# Patient Record
Sex: Female | Born: 1945 | Race: White | Hispanic: No | Marital: Married
Health system: Southern US, Community
[De-identification: ages and names within clinical notes are randomized; demographics above are authoritative.]

---

## 2016-11-29 ENCOUNTER — Other Ambulatory Visit (HOSPITAL_COMMUNITY): Payer: Medicare Other

## 2016-11-29 ENCOUNTER — Inpatient Hospital Stay
Admission: RE | Admit: 2016-11-29 | Discharge: 2017-01-09 | Disposition: A | Discharge status: Skilled Nursing Facility | Payer: Medicare Other | Source: Ambulatory Visit | Attending: Internal Medicine | Admitting: Internal Medicine

## 2016-11-29 ENCOUNTER — Ambulatory Visit (HOSPITAL_COMMUNITY)
Admission: AD | Admit: 2016-11-29 | Discharge: 2016-11-29 | Disposition: A | Discharge status: Home / Self Care | Payer: Medicare Other | Source: Other Acute Inpatient Hospital | Attending: Internal Medicine | Admitting: Internal Medicine

## 2016-11-29 DIAGNOSIS — J9690 Respiratory failure, unspecified, unspecified whether with hypoxia or hypercapnia: Secondary | ICD-10-CM

## 2016-11-29 DIAGNOSIS — Z4659 Encounter for fitting and adjustment of other gastrointestinal appliance and device: Secondary | ICD-10-CM

## 2016-11-29 DIAGNOSIS — Z978 Presence of other specified devices: Secondary | ICD-10-CM

## 2016-11-29 DIAGNOSIS — Z431 Encounter for attention to gastrostomy: Secondary | ICD-10-CM

## 2016-11-29 DIAGNOSIS — Z931 Gastrostomy status: Secondary | ICD-10-CM

## 2016-11-29 DIAGNOSIS — Z0189 Encounter for other specified special examinations: Secondary | ICD-10-CM

## 2016-11-29 LAB — CBC WITH DIFFERENTIAL/PLATELET
BASOS ABS: 0 10*3/uL (ref 0.0–0.1)
Basophils Relative: 0 %
EOS ABS: 0.2 10*3/uL (ref 0.0–0.7)
Eosinophils Relative: 1 %
HCT: 23.6 % — ABNORMAL LOW (ref 36.0–46.0)
HEMOGLOBIN: 7.7 g/dL — AB (ref 12.0–15.0)
LYMPHS PCT: 13 %
Lymphs Abs: 2.4 10*3/uL (ref 0.7–4.0)
MCH: 31.8 pg (ref 26.0–34.0)
MCHC: 32.6 g/dL (ref 30.0–36.0)
MCV: 97.5 fL (ref 78.0–100.0)
MONOS PCT: 24 %
Monocytes Absolute: 4.4 10*3/uL — ABNORMAL HIGH (ref 0.1–1.0)
Neutro Abs: 11.2 10*3/uL — ABNORMAL HIGH (ref 1.7–7.7)
Neutrophils Relative %: 62 %
Platelets: 149 10*3/uL — ABNORMAL LOW (ref 150–400)
RBC: 2.42 MIL/uL — AB (ref 3.87–5.11)
RDW: 16 % — ABNORMAL HIGH (ref 11.5–15.5)
WBC: 18.2 10*3/uL — ABNORMAL HIGH (ref 4.0–10.5)

## 2016-11-29 LAB — BLOOD GAS, ARTERIAL
ACID-BASE EXCESS: 3.3 mmol/L — AB (ref 0.0–2.0)
BICARBONATE: 27.1 mmol/L (ref 20.0–28.0)
FIO2: 30
LHR: 12 {breaths}/min
MECHVT: 350 mL
O2 Saturation: 94.5 %
PEEP/CPAP: 5 cmH2O
Patient temperature: 98.6
pCO2 arterial: 39.7 mmHg (ref 32.0–48.0)
pH, Arterial: 7.448 (ref 7.350–7.450)
pO2, Arterial: 73.8 mmHg — ABNORMAL LOW (ref 83.0–108.0)

## 2016-11-29 LAB — HEMOGLOBIN A1C
HEMOGLOBIN A1C: 6.4 % — AB (ref 4.8–5.6)
MEAN PLASMA GLUCOSE: 136.98 mg/dL

## 2016-11-30 LAB — VANCOMYCIN, TROUGH: VANCOMYCIN TR: 29 ug/mL — AB (ref 15–20)

## 2016-11-30 LAB — COMPREHENSIVE METABOLIC PANEL
ALBUMIN: 1.9 g/dL — AB (ref 3.5–5.0)
ALT: 39 U/L (ref 14–54)
AST: 79 U/L — ABNORMAL HIGH (ref 15–41)
Alkaline Phosphatase: 226 U/L — ABNORMAL HIGH (ref 38–126)
Anion gap: 10 (ref 5–15)
BUN: 23 mg/dL — AB (ref 6–20)
CHLORIDE: 101 mmol/L (ref 101–111)
CO2: 27 mmol/L (ref 22–32)
CREATININE: 1.56 mg/dL — AB (ref 0.44–1.00)
Calcium: 7.5 mg/dL — ABNORMAL LOW (ref 8.9–10.3)
GFR calc non Af Amer: 33 mL/min — ABNORMAL LOW (ref >60)
GFR, EST AFRICAN AMERICAN: 38 mL/min — AB (ref >60)
Glucose, Bld: 189 mg/dL — ABNORMAL HIGH (ref 65–99)
POTASSIUM: 3.1 mmol/L — AB (ref 3.5–5.1)
SODIUM: 138 mmol/L (ref 135–145)
Total Bilirubin: 0.9 mg/dL (ref 0.3–1.2)
Total Protein: 5.2 g/dL — ABNORMAL LOW (ref 6.5–8.1)

## 2016-11-30 LAB — CBC WITH DIFFERENTIAL/PLATELET
BAND NEUTROPHILS: 2 %
BASOS ABS: 0 10*3/uL (ref 0.0–0.1)
BASOS PCT: 0 %
Blasts: 0 %
EOS ABS: 0.2 10*3/uL (ref 0.0–0.7)
EOS PCT: 1 %
HCT: 23.1 % — ABNORMAL LOW (ref 36.0–46.0)
Hemoglobin: 7.5 g/dL — ABNORMAL LOW (ref 12.0–15.0)
LYMPHS ABS: 2.1 10*3/uL (ref 0.7–4.0)
Lymphocytes Relative: 12 %
MCH: 32.1 pg (ref 26.0–34.0)
MCHC: 32.5 g/dL (ref 30.0–36.0)
MCV: 98.7 fL (ref 78.0–100.0)
METAMYELOCYTES PCT: 2 %
MONO ABS: 0.9 10*3/uL (ref 0.1–1.0)
MYELOCYTES: 1 %
Monocytes Relative: 5 %
Neutro Abs: 14.7 10*3/uL — ABNORMAL HIGH (ref 1.7–7.7)
Neutrophils Relative %: 77 %
Other: 0 %
PLATELETS: 191 10*3/uL (ref 150–400)
Promyelocytes Absolute: 0 %
RBC: 2.34 MIL/uL — ABNORMAL LOW (ref 3.87–5.11)
RDW: 16.3 % — ABNORMAL HIGH (ref 11.5–15.5)
WBC: 17.9 10*3/uL — AB (ref 4.0–10.5)
nRBC: 0 /100 WBC

## 2016-11-30 LAB — HEMOGLOBIN A1C
HEMOGLOBIN A1C: 6.4 % — AB (ref 4.8–5.6)
Mean Plasma Glucose: 136.98 mg/dL

## 2016-11-30 LAB — C DIFFICILE QUICK SCREEN W PCR REFLEX
C DIFFICILE (CDIFF) TOXIN: NEGATIVE
C DIFFICLE (CDIFF) ANTIGEN: NEGATIVE
C Diff interpretation: NOT DETECTED

## 2016-12-01 LAB — VANCOMYCIN, TROUGH: VANCOMYCIN TR: 20 ug/mL (ref 15–20)

## 2016-12-01 NOTE — H&P (Signed)
PREOPERATIVE H&P  Chief Complaint: Respiratory failure  HPI: Nichole [name] is a 71 y.o. female who presents for evaluation of tracheostomy. [place] with mental status changes. She subsequently developed seizures and required intubation for airway protection. EEGs demonstrated left temporal parietal foci of seizure activity. 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 Follow-up EEG's demonstrated encephalopathy. She also has history of coronary artery disease. She was treated conservatively and [facility]. At time of transfer it was recommended that she undergo tracheotomy as she had been intubated since 11/ 3 without significant improvement in her mental status. I was subsequently consulted on 11/14. She's taken to the operating room at this time for tracheotomy.  No past medical history on file. No past surgical history on file. Social History   Socioeconomic History  . Marital status: Married    Spouse name: Not on file  . Number of children: Not on file  . Years of education: Not on file  . Highest education level: Not on file  Social Needs  . Financial resource strain: Not on file  . Food insecurity - worry: Not on file  . Food insecurity - inability: Not on file  . Transportation needs - medical: Not on file  . Transportation needs - non-medical: Not on file  Occupational History  . Not on file  Tobacco Use  . Smoking status: Not on file  Substance and Sexual Activity  . Alcohol use: Not on file  . Drug use: Not on file  . Sexual activity: Not on file  Other Topics Concern  . Not on file  Social History Narrative  . Not on file   No family history on file. Allergies not on file Prior to Admission medications   Not on File     Positive ROS: neg  All other systems have been reviewed and were otherwise negative with the exception of those mentioned in the HPI and as  above.  Physical Exam: There were no vitals filed for this visit.  General: Intubated on the ventilator. Nonresponsive. Neck: No palpable adenopathy or thyroid nodules. Obese neck. Trachea midl Cardiovascular: Regular rate and rhythm, no murmur.  Respiratory: Clear to auscultation  Assessment/Plan: RESPRITORY FAILURE Plan for Procedure(s): TRACHEOSTOMY   Dillard Cannonhristopher Newman, MD 12/01/2016 4:57 PM

## 2016-12-02 ENCOUNTER — Encounter
Admission: RE | Disposition: A | Discharge status: Skilled Nursing Facility | Payer: Self-pay | Source: Ambulatory Visit | Attending: Internal Medicine

## 2016-12-02 ENCOUNTER — Encounter (HOSPITAL_COMMUNITY): Payer: Medicare Other | Admitting: Anesthesiology

## 2016-12-02 ENCOUNTER — Other Ambulatory Visit (HOSPITAL_COMMUNITY): Payer: Medicare Other

## 2016-12-02 HISTORY — PX: TRACHEOSTOMY TUBE PLACEMENT: SHX814

## 2016-12-02 LAB — BASIC METABOLIC PANEL
ANION GAP: 5 (ref 5–15)
BUN: 25 mg/dL — ABNORMAL HIGH (ref 6–20)
CALCIUM: 7.4 mg/dL — AB (ref 8.9–10.3)
CO2: 31 mmol/L (ref 22–32)
Chloride: 101 mmol/L (ref 101–111)
Creatinine, Ser: 1.38 mg/dL — ABNORMAL HIGH (ref 0.44–1.00)
GFR, EST AFRICAN AMERICAN: 44 mL/min — AB (ref >60)
GFR, EST NON AFRICAN AMERICAN: 38 mL/min — AB (ref >60)
Glucose, Bld: 207 mg/dL — ABNORMAL HIGH (ref 65–99)
POTASSIUM: 3.2 mmol/L — AB (ref 3.5–5.1)
SODIUM: 137 mmol/L (ref 135–145)

## 2016-12-02 LAB — CBC WITH DIFFERENTIAL/PLATELET
BASOS PCT: 0 %
BLASTS: 0 %
Band Neutrophils: 1 %
Basophils Absolute: 0 10*3/uL (ref 0.0–0.1)
Eosinophils Absolute: 0.2 10*3/uL (ref 0.0–0.7)
Eosinophils Relative: 1 %
HCT: 23.5 % — ABNORMAL LOW (ref 36.0–46.0)
HEMOGLOBIN: 7.5 g/dL — AB (ref 12.0–15.0)
LYMPHS PCT: 14 %
Lymphs Abs: 2.2 10*3/uL (ref 0.7–4.0)
MCH: 31.4 pg (ref 26.0–34.0)
MCHC: 31.9 g/dL (ref 30.0–36.0)
MCV: 98.3 fL (ref 78.0–100.0)
MONO ABS: 1.6 10*3/uL — AB (ref 0.1–1.0)
MYELOCYTES: 0 %
Metamyelocytes Relative: 2 %
Monocytes Relative: 10 %
NEUTROS PCT: 72 %
Neutro Abs: 11.9 10*3/uL — ABNORMAL HIGH (ref 1.7–7.7)
Other: 0 %
PLATELETS: 208 10*3/uL (ref 150–400)
PROMYELOCYTES ABS: 0 %
RBC: 2.39 MIL/uL — AB (ref 3.87–5.11)
RDW: 16.7 % — ABNORMAL HIGH (ref 11.5–15.5)
SMEAR REVIEW: ADEQUATE
WBC: 15.9 10*3/uL — AB (ref 4.0–10.5)
nRBC: 0 /100 WBC

## 2016-12-02 LAB — CULTURE, RESPIRATORY: CULTURE: NORMAL

## 2016-12-02 LAB — VANCOMYCIN, TROUGH: Vancomycin Tr: 21 ug/mL (ref 15–20)

## 2016-12-02 LAB — CULTURE, RESPIRATORY W GRAM STAIN

## 2016-12-02 LAB — MAGNESIUM: MAGNESIUM: 1.6 mg/dL — AB (ref 1.7–2.4)

## 2016-12-02 SURGERY — CREATION, TRACHEOSTOMY
Anesthesia: General | Site: Neck

## 2016-12-02 MED ORDER — PHENYLEPHRINE 40 MCG/ML (10ML) SYRINGE FOR IV PUSH (FOR BLOOD PRESSURE SUPPORT)
PREFILLED_SYRINGE | INTRAVENOUS | Status: AC
  Filled 2016-12-02: qty 10

## 2016-12-02 MED ORDER — ROCURONIUM BROMIDE 10 MG/ML (PF) SYRINGE
PREFILLED_SYRINGE | INTRAVENOUS | Status: DC | PRN
  Administered 2016-12-02: 30 mg via INTRAVENOUS

## 2016-12-02 MED ORDER — ROCURONIUM BROMIDE 10 MG/ML (PF) SYRINGE
PREFILLED_SYRINGE | INTRAVENOUS | Status: AC
Start: 2016-12-02 — End: ?
  Filled 2016-12-02: qty 5

## 2016-12-02 MED ORDER — MIDAZOLAM HCL 5 MG/5ML IJ SOLN
INTRAMUSCULAR | Status: DC | PRN
  Administered 2016-12-02: 1 mg via INTRAVENOUS

## 2016-12-02 MED ORDER — MIDAZOLAM HCL 2 MG/2ML IJ SOLN
INTRAMUSCULAR | Status: AC
  Filled 2016-12-02: qty 2

## 2016-12-02 MED ORDER — LIDOCAINE-EPINEPHRINE 1 %-1:100000 IJ SOLN
INTRAMUSCULAR | Status: AC
Start: 2016-12-02 — End: ?
  Filled 2016-12-02: qty 1

## 2016-12-02 MED ORDER — EPHEDRINE SULFATE-NACL 50-0.9 MG/10ML-% IV SOSY
PREFILLED_SYRINGE | INTRAVENOUS | Status: DC | PRN
  Administered 2016-12-02: 10 mg via INTRAVENOUS

## 2016-12-02 MED ORDER — LIDOCAINE-EPINEPHRINE 1 %-1:100000 IJ SOLN
INTRAMUSCULAR | Status: DC | PRN
  Administered 2016-12-02: 4 mL

## 2016-12-02 MED ORDER — LACTATED RINGERS IV SOLN
INTRAVENOUS | Status: DC | PRN
  Administered 2016-12-02: 08:00:00 via INTRAVENOUS

## 2016-12-02 MED ORDER — FENTANYL CITRATE (PF) 100 MCG/2ML IJ SOLN
INTRAMUSCULAR | Status: DC | PRN
  Administered 2016-12-02: 100 ug via INTRAVENOUS

## 2016-12-02 MED ORDER — 0.9 % SODIUM CHLORIDE (POUR BTL) OPTIME
TOPICAL | Status: DC | PRN
  Administered 2016-12-02: 1000 mL

## 2016-12-02 MED ORDER — PHENYLEPHRINE 40 MCG/ML (10ML) SYRINGE FOR IV PUSH (FOR BLOOD PRESSURE SUPPORT)
PREFILLED_SYRINGE | INTRAVENOUS | Status: DC | PRN
  Administered 2016-12-02 (×2): 120 ug via INTRAVENOUS

## 2016-12-02 MED ORDER — EPHEDRINE 5 MG/ML INJ
INTRAVENOUS | Status: AC
Start: 2016-12-02 — End: ?
  Filled 2016-12-02: qty 10

## 2016-12-02 MED ORDER — FENTANYL CITRATE (PF) 250 MCG/5ML IJ SOLN
INTRAMUSCULAR | Status: AC
  Filled 2016-12-02: qty 5

## 2016-12-02 SURGICAL SUPPLY — 39 items
ATTRACTOMAT 16X20 MAGNETIC DRP (DRAPES) ×3 IMPLANT
BLADE SURG 15 STRL LF DISP TIS (BLADE) ×1 IMPLANT
BLADE SURG 15 STRL SS (BLADE) ×2
CLEANER TIP ELECTROSURG 2X2 (MISCELLANEOUS) ×3 IMPLANT
COVER SURGICAL LIGHT HANDLE (MISCELLANEOUS) ×3 IMPLANT
DRAPE HALF SHEET 40X57 (DRAPES) IMPLANT
ELECT COATED BLADE 2.86 ST (ELECTRODE) ×3 IMPLANT
ELECT REM PT RETURN 9FT ADLT (ELECTROSURGICAL) ×3
ELECTRODE REM PT RTRN 9FT ADLT (ELECTROSURGICAL) ×1 IMPLANT
GAUZE SPONGE 4X4 16PLY XRAY LF (GAUZE/BANDAGES/DRESSINGS) ×3 IMPLANT
GEL ULTRASOUND 20GR AQUASONIC (MISCELLANEOUS) ×3 IMPLANT
GLOVE SS BIOGEL STRL SZ 7.5 (GLOVE) ×1 IMPLANT
GLOVE SUPERSENSE BIOGEL SZ 7.5 (GLOVE) ×2
GOWN STRL REUS W/ TWL LRG LVL3 (GOWN DISPOSABLE) ×1 IMPLANT
GOWN STRL REUS W/ TWL XL LVL3 (GOWN DISPOSABLE) ×1 IMPLANT
GOWN STRL REUS W/TWL LRG LVL3 (GOWN DISPOSABLE) ×2
GOWN STRL REUS W/TWL XL LVL3 (GOWN DISPOSABLE) ×2
HOLDER TRACH TUBE VELCRO 19.5 (MISCELLANEOUS) ×3 IMPLANT
KIT BASIN OR (CUSTOM PROCEDURE TRAY) ×3 IMPLANT
KIT ROOM TURNOVER OR (KITS) ×3 IMPLANT
KIT SUCTION CATH 14FR (SUCTIONS) ×3 IMPLANT
NEEDLE HYPO 25GX1X1/2 BEV (NEEDLE) ×3 IMPLANT
NS IRRIG 1000ML POUR BTL (IV SOLUTION) ×3 IMPLANT
PACK EENT II TURBAN DRAPE (CUSTOM PROCEDURE TRAY) ×3 IMPLANT
PAD ARMBOARD 7.5X6 YLW CONV (MISCELLANEOUS) IMPLANT
PENCIL BUTTON HOLSTER BLD 10FT (ELECTRODE) ×3 IMPLANT
SPONGE DRAIN TRACH 4X4 STRL 2S (GAUZE/BANDAGES/DRESSINGS) ×3 IMPLANT
SPONGE INTESTINAL PEANUT (DISPOSABLE) ×3 IMPLANT
SUT SILK 2 0 SH CR/8 (SUTURE) ×3 IMPLANT
SUT SILK 3 0 TIES 10X30 (SUTURE) ×3 IMPLANT
SYR 5ML LUER SLIP (SYRINGE) ×3 IMPLANT
SYR CONTROL 10ML LL (SYRINGE) ×3 IMPLANT
TOWEL OR 17X24 6PK STRL BLUE (TOWEL DISPOSABLE) ×3 IMPLANT
TOWEL OR 17X26 10 PK STRL BLUE (TOWEL DISPOSABLE) ×3 IMPLANT
TUBE CONNECTING 12'X1/4 (SUCTIONS) ×1
TUBE CONNECTING 12X1/4 (SUCTIONS) ×2 IMPLANT
TUBE TRACH 7.0 EXL PROX CUF (TUBING) ×3 IMPLANT
TUBE TRACH SHILEY  6 DIST  CUF (TUBING) ×3 IMPLANT
TUBE TRACH SHILEY 8 DIST CUF (TUBING) IMPLANT

## 2016-12-02 NOTE — Anesthesia Postprocedure Evaluation (Signed)
Anesthesia Post Note  Patient: Nichole [name]  Procedure(s) Performed: TRACHEOSTOMY (N/A Neck)     Patient location during evaluation: Other Anesthesia Type: General Level of consciousness: obtunded/minimal responses and patient remains intubated per anesthesia plan Pain management: pain level controlled Vital Signs Assessment: post-procedure vital signs reviewed and stable Respiratory status: patient on ventilator - see flowsheet for VS and patient remains intubated per anesthesia plan Cardiovascular status: blood pressure returned to baseline Postop Assessment: no apparent nausea or vomiting Anesthetic complications: no Comments: Pt transported back to [facility]    Last Vitals: There were no vitals filed for this visit.  Last Pain: There were no vitals filed for this visit.               Germaine PomfretJACKSON,E. Maycol Hoying

## 2016-12-02 NOTE — Transfer of Care (Signed)
Immediate Anesthesia Transfer of Care Note  Patient: Nichole [name]  Procedure(s) Performed: TRACHEOSTOMY (N/A Neck)  Patient Location: Nursing Unit  Anesthesia Type:General  Level of Consciousness: unresponsive and Patient remains intubated per anesthesia plan  Airway & Oxygen Therapy: Patient remains intubated per anesthesia plan  Post-op Assessment: Report given to RN and Post -op Vital signs reviewed and stable  Post vital signs: Reviewed and stable  Last Vitals: There were no vitals filed for this visit.  Last Pain: There were no vitals filed for this visit.       Complications: No apparent anesthesia complications

## 2016-12-02 NOTE — Brief Op Note (Signed)
12/02/2016  8:28 AM  PATIENT:  Meriel FlavorsBobbie Konen  71 y.o. female  PRE-OPERATIVE DIAGNOSIS:  RESPIRATORY FAILURE  POST-OPERATIVE DIAGNOSIS:  RESPIRATORY FAILURE  PROCEDURE:  Procedure(s): TRACHEOSTOMY (N/A)  #6 Shiley  SURGEON:  Surgeon(s) and Role:    * Drema HalonNewman, Christopher E, MD - Primary  PHYSICIAN ASSISTANT:   ASSISTANTS: none   ANESTHESIA:   general  EBL:  minimal BLOOD ADMINISTERED:none  DRAINS: none   LOCAL MEDICATIONS USED:  XYLOCAINE with EPI  4 cc  SPECIMEN:  No Specimen  DISPOSITION OF SPECIMEN:  N/A  COUNTS:  YES  TOURNIQUET:  * No tourniquets in log *  DICTATION: .Other Dictation: Dictation Number [id number]  PLAN OF CARE: Discharge to home after PACU  PATIENT DISPOSITION:  PACU - hemodynamically stable.   Delay start of Pharmacological VTE agent (>24hrs) due to surgical blood loss or risk of bleeding: not applicable

## 2016-12-02 NOTE — Anesthesia Preprocedure Evaluation (Signed)
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 on medications and Pt. 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), Insulin Dependent  Renal/GU Renal InsufficiencyRenal disease (creat 1.38, K+ 3.2)     Musculoskeletal   Abdominal   Peds  Hematology  (+) Blood dyscrasia (Hb 7.5, plt 208k), anemia ,   Anesthesia Other Findings   Reproductive/Obstetrics                             Anesthesia Physical Anesthesia Plan  ASA: IV  Anesthesia Plan: General   Post-op Pain Management:    Induction: Inhalational  PONV Risk Score and Plan: 3 and Treatment may vary due to age or medical condition  Airway Management Planned: Oral ETT and Tracheostomy  Additional Equipment:   Intra-op Plan:   Post-operative Plan: Post-operative intubation/ventilation  Informed Consent:   History available from chart only  Plan Discussed with: CRNA and Surgeon  Anesthesia Plan Comments: (Plan routine monitors, GETA changing to trach Consent by Ezzard StandingNewman and [facility] staff, pt intubated, encephalopathic)        Anesthesia Quick Evaluation

## 2016-12-04 LAB — CULTURE, BLOOD (ROUTINE X 2)
CULTURE: NO GROWTH
Culture: NO GROWTH
SPECIAL REQUESTS: ADEQUATE
Special Requests: ADEQUATE

## 2016-12-04 LAB — VANCOMYCIN, TROUGH: VANCOMYCIN TR: 27 ug/mL — AB (ref 15–20)

## 2016-12-05 ENCOUNTER — Inpatient Hospital Stay (HOSPITAL_BASED_OUTPATIENT_CLINIC_OR_DEPARTMENT_OTHER)
Admission: RE | Admit: 2016-12-05 | Discharge: 2016-12-05 | Disposition: A | Discharge status: Home / Self Care | Payer: Medicare Other | Source: Ambulatory Visit | Attending: Internal Medicine | Admitting: Internal Medicine

## 2016-12-05 ENCOUNTER — Encounter (HOSPITAL_COMMUNITY): Payer: Self-pay | Admitting: Otolaryngology

## 2016-12-05 DIAGNOSIS — G9340 Encephalopathy, unspecified: Secondary | ICD-10-CM

## 2016-12-05 LAB — BASIC METABOLIC PANEL
Anion gap: 9 (ref 5–15)
BUN: 29 mg/dL — AB (ref 6–20)
CALCIUM: 7.7 mg/dL — AB (ref 8.9–10.3)
CO2: 31 mmol/L (ref 22–32)
CREATININE: 1.4 mg/dL — AB (ref 0.44–1.00)
Chloride: 96 mmol/L — ABNORMAL LOW (ref 101–111)
GFR calc Af Amer: 43 mL/min — ABNORMAL LOW (ref >60)
GFR, EST NON AFRICAN AMERICAN: 37 mL/min — AB (ref >60)
GLUCOSE: 220 mg/dL — AB (ref 65–99)
Potassium: 3.7 mmol/L (ref 3.5–5.1)
Sodium: 136 mmol/L (ref 135–145)

## 2016-12-05 NOTE — Procedures (Signed)
ELECTROENCEPHALOGRAM REPORT  Date of Study: 12/05/2016  Patient's Name: Meriel FlavorsBobbie Moctezuma MRN: [id number] Date of Birth: [birth year]  Referring Provider: Dr. Carron CurieAli Hijazi  Clinical History: This is a 71 year old woman with altered mental status s/p tracheostomy  Medications: None listed  Technical Summary: A multichannel digital EEG recording measured by the international 10-20 system with electrodes applied with paste and impedances below 5000 ohms performed as portable with EKG monitoring in an awake and drowsy patient.  Hyperventilation and photic stimulation were not performed.  The digital EEG was referentially recorded, reformatted, and digitally filtered in a variety of bipolar and referential montages for optimal display.   Description: The patient is awake and drowsy during the recording. She followed command to close eyes. There is an asymmetric 7 Hz posterior dominant rhythm, better formed over the right occipital region. The background shows a small amount of diffuse 4-5 Hz theta slowing. There is additional polymorphic focal theta and delta slowing over the left temporal region. During drowsiness, there is an increase in theta and delta slowing of the background. Deeper stages of sleep were not seen. .Hyperventilation and photic stimulation were not performed. There were occasional broad sharp waves over the left temporal region. There were no electrographic seizures seen.    EKG lead was unremarkable.  Impression: This awake and drowsy EEG is abnormal due to the presence of: 1. Mild diffuse background slowing 2. Additional focal slowing over the left temporal region 3. Occasional epileptiform discharges over the left temporal region  Clinical Correlation of the above findings indicates diffuse cerebral dysfunction that is non-specific in etiology and can be seen with hypoxic/ischemic injury, toxic/metabolic encephalopathies, or medication effect. Focal slowing over the left  temporal region indicates focal cerebral dysfunction in this region suggestive of underlying structural or physiologic abnormality. There is a possible tendency for seizures to arise from this region. There were no electrographic seizures in this study. Clinical correlation is advised.   Patrcia DollyKaren Cedrik Heindl, M.D.

## 2016-12-05 NOTE — Op Note (Signed)
NAME:  [name], Nichole                    ACCOUNT NO.:  MEDICAL RECORD NO.:  [id number]  LOCATION:                                 FACILITY:  PHYSICIAN:  Kristine GarbeChristopher E. Ezzard StandingNewman, M.D. DATE OF BIRTH:  DATE OF PROCEDURE:  12/02/2016 DATE OF DISCHARGE:                              OPERATIVE REPORT   PREOPERATIVE DIAGNOSIS:  Acute on chronic respiratory failure.  POSTOPERATIVE DIAGNOSIS:  Acute on chronic respiratory failure.  OPERATION PERFORMED:  Tracheostomy with a #6 Shiley cuffed tube.  SURGEON:  Kristine GarbeChristopher E. Ezzard StandingNewman, MD.  ANESTHESIA:  General.  COMPLICATIONS:  None.  ESTIMATED BLOOD LOSS:  Minimal.  BRIEF CLINICAL NOTE:  Nichole FlavorsBobbie Savoca is a 71 year old female who was recently admitted to an outside hospital in [place] because of mental status changes and seizure with status epilepticus.  She had previous history of stroke on the left side and developed seizure activity from the stroke area.  She also has history of coronary artery disease.  She was intubated 2 weeks ago at an outside hospital and has subsequently [facility] 3 days ago at which time it was recommended that she undergo a tracheotomy.  EEG shows diffuse encephalopathy with poor mental status function.  She was [facility] on a ventilator and had been intubated for 2 weeks.  She is taken to the operating room this time for placement of tracheotomy.  OPERATIVE NOTE:  The patient was brought straight down from [facility] intubated.  She remained in her bed.  Her neck was extended back.  She was fairly obese lady with a thick neck.  The area for the proposed tracheotomy was marked out and injected with 4 mL of Xylocaine with epinephrine for hemostasis.  A vertical incision was made just above the suprasternal notch in midline.  Dissection was carried down through the subcutaneous tissue.  Strap muscles were divided in the midline.  The  cricoid cartilage was identified and the first 2 tracheal rings were exposed by dividing the thyroid isthmus in midline with cautery.  There was minimal bleeding.  Following exposure of the first 2- 3 tracheal rings below the cricoid cartilage.  A horizontal tracheotomy was made between the first and second tracheal rings.  The endotracheal tube was removed.  A #6 cuffed Shiley tracheotomy was placed without difficulty.  The patient was ventilated well.  The tracheotomy was secured to the neck with a 2-0 silk sutures x4 and Velcro trach collar around the neck.  This completed the procedure.  The patient was subsequently transferred back [facility].          ______________________________ Kristine Garbehristopher E. Ezzard StandingNewman, M.D.     CEN/MEDQ  D:  12/02/2016  T:  12/03/2016  Job:  [id number]

## 2016-12-06 LAB — BASIC METABOLIC PANEL
Anion gap: 9 (ref 5–15)
BUN: 34 mg/dL — ABNORMAL HIGH (ref 6–20)
CO2: 32 mmol/L (ref 22–32)
Calcium: 7.9 mg/dL — ABNORMAL LOW (ref 8.9–10.3)
Chloride: 96 mmol/L — ABNORMAL LOW (ref 101–111)
Creatinine, Ser: 1.44 mg/dL — ABNORMAL HIGH (ref 0.44–1.00)
GFR calc Af Amer: 42 mL/min — ABNORMAL LOW (ref >60)
GFR calc non Af Amer: 36 mL/min — ABNORMAL LOW (ref >60)
Glucose, Bld: 211 mg/dL — ABNORMAL HIGH (ref 65–99)
Potassium: 3.4 mmol/L — ABNORMAL LOW (ref 3.5–5.1)
Sodium: 137 mmol/L (ref 135–145)

## 2016-12-07 LAB — BASIC METABOLIC PANEL
ANION GAP: 8 (ref 5–15)
BUN: 39 mg/dL — AB (ref 6–20)
CALCIUM: 8.3 mg/dL — AB (ref 8.9–10.3)
CO2: 34 mmol/L — AB (ref 22–32)
Chloride: 95 mmol/L — ABNORMAL LOW (ref 101–111)
Creatinine, Ser: 1.48 mg/dL — ABNORMAL HIGH (ref 0.44–1.00)
GFR calc Af Amer: 41 mL/min — ABNORMAL LOW (ref >60)
GFR calc non Af Amer: 35 mL/min — ABNORMAL LOW (ref >60)
GLUCOSE: 215 mg/dL — AB (ref 65–99)
Potassium: 3.9 mmol/L (ref 3.5–5.1)
Sodium: 137 mmol/L (ref 135–145)

## 2016-12-10 LAB — BASIC METABOLIC PANEL
Anion gap: 11 (ref 5–15)
BUN: 52 mg/dL — AB (ref 6–20)
CHLORIDE: 93 mmol/L — AB (ref 101–111)
CO2: 32 mmol/L (ref 22–32)
Calcium: 8.5 mg/dL — ABNORMAL LOW (ref 8.9–10.3)
Creatinine, Ser: 1.89 mg/dL — ABNORMAL HIGH (ref 0.44–1.00)
GFR, EST AFRICAN AMERICAN: 30 mL/min — AB (ref >60)
GFR, EST NON AFRICAN AMERICAN: 26 mL/min — AB (ref >60)
Glucose, Bld: 152 mg/dL — ABNORMAL HIGH (ref 65–99)
POTASSIUM: 3.8 mmol/L (ref 3.5–5.1)
SODIUM: 136 mmol/L (ref 135–145)

## 2016-12-11 LAB — BASIC METABOLIC PANEL
ANION GAP: 12 (ref 5–15)
BUN: 55 mg/dL — ABNORMAL HIGH (ref 6–20)
CALCIUM: 8.7 mg/dL — AB (ref 8.9–10.3)
CO2: 30 mmol/L (ref 22–32)
Chloride: 94 mmol/L — ABNORMAL LOW (ref 101–111)
Creatinine, Ser: 1.78 mg/dL — ABNORMAL HIGH (ref 0.44–1.00)
GFR, EST AFRICAN AMERICAN: 32 mL/min — AB (ref >60)
GFR, EST NON AFRICAN AMERICAN: 28 mL/min — AB (ref >60)
Glucose, Bld: 153 mg/dL — ABNORMAL HIGH (ref 65–99)
POTASSIUM: 4.1 mmol/L (ref 3.5–5.1)
Sodium: 136 mmol/L (ref 135–145)

## 2016-12-12 LAB — CBC
HCT: 29.5 % — ABNORMAL LOW (ref 36.0–46.0)
Hemoglobin: 9 g/dL — ABNORMAL LOW (ref 12.0–15.0)
MCH: 30.5 pg (ref 26.0–34.0)
MCHC: 30.5 g/dL (ref 30.0–36.0)
MCV: 100 fL (ref 78.0–100.0)
PLATELETS: 419 10*3/uL — AB (ref 150–400)
RBC: 2.95 MIL/uL — AB (ref 3.87–5.11)
RDW: 17.5 % — AB (ref 11.5–15.5)
WBC: 9 10*3/uL (ref 4.0–10.5)

## 2016-12-12 LAB — BASIC METABOLIC PANEL
Anion gap: 11 (ref 5–15)
BUN: 55 mg/dL — ABNORMAL HIGH (ref 6–20)
CALCIUM: 8.8 mg/dL — AB (ref 8.9–10.3)
CHLORIDE: 97 mmol/L — AB (ref 101–111)
CO2: 28 mmol/L (ref 22–32)
CREATININE: 1.65 mg/dL — AB (ref 0.44–1.00)
GFR calc non Af Amer: 31 mL/min — ABNORMAL LOW (ref >60)
GFR, EST AFRICAN AMERICAN: 36 mL/min — AB (ref >60)
Glucose, Bld: 185 mg/dL — ABNORMAL HIGH (ref 65–99)
Potassium: 4.3 mmol/L (ref 3.5–5.1)
SODIUM: 136 mmol/L (ref 135–145)

## 2016-12-14 LAB — BASIC METABOLIC PANEL
Anion gap: 8 (ref 5–15)
BUN: 73 mg/dL — AB (ref 6–20)
CALCIUM: 9 mg/dL (ref 8.9–10.3)
CO2: 29 mmol/L (ref 22–32)
CREATININE: 1.9 mg/dL — AB (ref 0.44–1.00)
Chloride: 99 mmol/L — ABNORMAL LOW (ref 101–111)
GFR calc Af Amer: 30 mL/min — ABNORMAL LOW (ref >60)
GFR, EST NON AFRICAN AMERICAN: 26 mL/min — AB (ref >60)
Glucose, Bld: 152 mg/dL — ABNORMAL HIGH (ref 65–99)
Potassium: 4.8 mmol/L (ref 3.5–5.1)
SODIUM: 136 mmol/L (ref 135–145)

## 2016-12-15 LAB — BASIC METABOLIC PANEL
Anion gap: 10 (ref 5–15)
BUN: 75 mg/dL — AB (ref 6–20)
CALCIUM: 8.8 mg/dL — AB (ref 8.9–10.3)
CO2: 28 mmol/L (ref 22–32)
Chloride: 99 mmol/L — ABNORMAL LOW (ref 101–111)
Creatinine, Ser: 1.75 mg/dL — ABNORMAL HIGH (ref 0.44–1.00)
GFR calc Af Amer: 33 mL/min — ABNORMAL LOW (ref >60)
GFR, EST NON AFRICAN AMERICAN: 29 mL/min — AB (ref >60)
GLUCOSE: 154 mg/dL — AB (ref 65–99)
POTASSIUM: 4.2 mmol/L (ref 3.5–5.1)
SODIUM: 137 mmol/L (ref 135–145)

## 2016-12-16 LAB — CBC
HCT: 28.3 % — ABNORMAL LOW (ref 36.0–46.0)
Hemoglobin: 8.4 g/dL — ABNORMAL LOW (ref 12.0–15.0)
MCH: 29.5 pg (ref 26.0–34.0)
MCHC: 29.7 g/dL — AB (ref 30.0–36.0)
MCV: 99.3 fL (ref 78.0–100.0)
Platelets: 315 10*3/uL (ref 150–400)
RBC: 2.85 MIL/uL — AB (ref 3.87–5.11)
RDW: 16.1 % — ABNORMAL HIGH (ref 11.5–15.5)
WBC: 7.3 10*3/uL (ref 4.0–10.5)

## 2016-12-16 LAB — BASIC METABOLIC PANEL
Anion gap: 8 (ref 5–15)
BUN: 70 mg/dL — ABNORMAL HIGH (ref 6–20)
CALCIUM: 8.9 mg/dL (ref 8.9–10.3)
CHLORIDE: 97 mmol/L — AB (ref 101–111)
CO2: 28 mmol/L (ref 22–32)
Creatinine, Ser: 1.59 mg/dL — ABNORMAL HIGH (ref 0.44–1.00)
GFR calc Af Amer: 37 mL/min — ABNORMAL LOW (ref >60)
GFR calc non Af Amer: 32 mL/min — ABNORMAL LOW (ref >60)
GLUCOSE: 137 mg/dL — AB (ref 65–99)
Potassium: 4.5 mmol/L (ref 3.5–5.1)
Sodium: 133 mmol/L — ABNORMAL LOW (ref 135–145)

## 2016-12-20 ENCOUNTER — Other Ambulatory Visit (HOSPITAL_COMMUNITY): Payer: Medicare Other

## 2016-12-20 LAB — BASIC METABOLIC PANEL
Anion gap: 14 (ref 5–15)
BUN: 59 mg/dL — AB (ref 6–20)
CALCIUM: 9.2 mg/dL (ref 8.9–10.3)
CHLORIDE: 93 mmol/L — AB (ref 101–111)
CO2: 23 mmol/L (ref 22–32)
CREATININE: 1.36 mg/dL — AB (ref 0.44–1.00)
GFR calc non Af Amer: 39 mL/min — ABNORMAL LOW (ref >60)
GFR, EST AFRICAN AMERICAN: 45 mL/min — AB (ref >60)
GLUCOSE: 142 mg/dL — AB (ref 65–99)
Potassium: 5 mmol/L (ref 3.5–5.1)
Sodium: 130 mmol/L — ABNORMAL LOW (ref 135–145)

## 2016-12-20 LAB — CBC
HCT: 29.9 % — ABNORMAL LOW (ref 36.0–46.0)
Hemoglobin: 9.1 g/dL — ABNORMAL LOW (ref 12.0–15.0)
MCH: 29.4 pg (ref 26.0–34.0)
MCHC: 30.4 g/dL (ref 30.0–36.0)
MCV: 96.8 fL (ref 78.0–100.0)
PLATELETS: 247 10*3/uL (ref 150–400)
RBC: 3.09 MIL/uL — ABNORMAL LOW (ref 3.87–5.11)
RDW: 15.8 % — AB (ref 11.5–15.5)
WBC: 9.3 10*3/uL (ref 4.0–10.5)

## 2016-12-24 ENCOUNTER — Other Ambulatory Visit (HOSPITAL_COMMUNITY): Payer: Medicare Other

## 2016-12-24 LAB — BASIC METABOLIC PANEL
ANION GAP: 15 (ref 5–15)
BUN: 59 mg/dL — AB (ref 6–20)
CO2: 20 mmol/L — ABNORMAL LOW (ref 22–32)
Calcium: 9.3 mg/dL (ref 8.9–10.3)
Chloride: 94 mmol/L — ABNORMAL LOW (ref 101–111)
Creatinine, Ser: 1.44 mg/dL — ABNORMAL HIGH (ref 0.44–1.00)
GFR calc Af Amer: 42 mL/min — ABNORMAL LOW (ref >60)
GFR, EST NON AFRICAN AMERICAN: 36 mL/min — AB (ref >60)
Glucose, Bld: 105 mg/dL — ABNORMAL HIGH (ref 65–99)
POTASSIUM: 4.3 mmol/L (ref 3.5–5.1)
SODIUM: 129 mmol/L — AB (ref 135–145)

## 2016-12-24 LAB — SODIUM, URINE, RANDOM: SODIUM UR: 21 mmol/L

## 2016-12-25 ENCOUNTER — Other Ambulatory Visit (HOSPITAL_COMMUNITY): Payer: Medicare Other

## 2016-12-27 LAB — BASIC METABOLIC PANEL
ANION GAP: 8 (ref 5–15)
BUN: 52 mg/dL — ABNORMAL HIGH (ref 6–20)
CO2: 26 mmol/L (ref 22–32)
Calcium: 9.2 mg/dL (ref 8.9–10.3)
Chloride: 94 mmol/L — ABNORMAL LOW (ref 101–111)
Creatinine, Ser: 1.06 mg/dL — ABNORMAL HIGH (ref 0.44–1.00)
GFR, EST NON AFRICAN AMERICAN: 52 mL/min — AB (ref >60)
GLUCOSE: 108 mg/dL — AB (ref 65–99)
POTASSIUM: 4.1 mmol/L (ref 3.5–5.1)
SODIUM: 128 mmol/L — AB (ref 135–145)

## 2016-12-27 LAB — CBC
HCT: 27.5 % — ABNORMAL LOW (ref 36.0–46.0)
Hemoglobin: 8.6 g/dL — ABNORMAL LOW (ref 12.0–15.0)
MCH: 29.3 pg (ref 26.0–34.0)
MCHC: 31.3 g/dL (ref 30.0–36.0)
MCV: 93.5 fL (ref 78.0–100.0)
PLATELETS: 223 10*3/uL (ref 150–400)
RBC: 2.94 MIL/uL — AB (ref 3.87–5.11)
RDW: 15.3 % (ref 11.5–15.5)
WBC: 6.9 10*3/uL (ref 4.0–10.5)

## 2016-12-30 LAB — BASIC METABOLIC PANEL
ANION GAP: 8 (ref 5–15)
BUN: 39 mg/dL — ABNORMAL HIGH (ref 6–20)
CO2: 27 mmol/L (ref 22–32)
Calcium: 9.5 mg/dL (ref 8.9–10.3)
Chloride: 97 mmol/L — ABNORMAL LOW (ref 101–111)
Creatinine, Ser: 1.02 mg/dL — ABNORMAL HIGH (ref 0.44–1.00)
GFR, EST NON AFRICAN AMERICAN: 54 mL/min — AB (ref >60)
Glucose, Bld: 148 mg/dL — ABNORMAL HIGH (ref 65–99)
POTASSIUM: 4.1 mmol/L (ref 3.5–5.1)
SODIUM: 132 mmol/L — AB (ref 135–145)

## 2016-12-30 NOTE — Progress Notes (Signed)
IR aware of request for g-tube.  Will need a CT scan to assess anatomic approval.  Will await scan.  Letha CapeKelly E Cobi Aldape 2:10 PM 12/30/2016

## 2017-01-02 ENCOUNTER — Other Ambulatory Visit (HOSPITAL_COMMUNITY): Payer: Medicare Other

## 2017-01-03 ENCOUNTER — Other Ambulatory Visit (HOSPITAL_COMMUNITY): Payer: Medicare Other

## 2017-01-03 LAB — BASIC METABOLIC PANEL
Anion gap: 7 (ref 5–15)
BUN: 45 mg/dL — AB (ref 6–20)
CALCIUM: 9.2 mg/dL (ref 8.9–10.3)
CHLORIDE: 94 mmol/L — AB (ref 101–111)
CO2: 29 mmol/L (ref 22–32)
CREATININE: 1.15 mg/dL — AB (ref 0.44–1.00)
GFR calc Af Amer: 55 mL/min — ABNORMAL LOW (ref >60)
GFR calc non Af Amer: 47 mL/min — ABNORMAL LOW (ref >60)
Glucose, Bld: 161 mg/dL — ABNORMAL HIGH (ref 65–99)
Potassium: 3.9 mmol/L (ref 3.5–5.1)
Sodium: 130 mmol/L — ABNORMAL LOW (ref 135–145)

## 2017-01-03 LAB — CBC WITH DIFFERENTIAL/PLATELET
Basophils Absolute: 0 10*3/uL (ref 0.0–0.1)
Basophils Relative: 0 %
EOS ABS: 0.3 10*3/uL (ref 0.0–0.7)
EOS PCT: 4 %
HCT: 28.8 % — ABNORMAL LOW (ref 36.0–46.0)
Hemoglobin: 8.8 g/dL — ABNORMAL LOW (ref 12.0–15.0)
LYMPHS ABS: 2.8 10*3/uL (ref 0.7–4.0)
Lymphocytes Relative: 46 %
MCH: 28.9 pg (ref 26.0–34.0)
MCHC: 30.6 g/dL (ref 30.0–36.0)
MCV: 94.4 fL (ref 78.0–100.0)
MONO ABS: 0.4 10*3/uL (ref 0.1–1.0)
MONOS PCT: 7 %
Neutro Abs: 2.6 10*3/uL (ref 1.7–7.7)
Neutrophils Relative %: 43 %
PLATELETS: 177 10*3/uL (ref 150–400)
RBC: 3.05 MIL/uL — ABNORMAL LOW (ref 3.87–5.11)
RDW: 16.2 % — AB (ref 11.5–15.5)
WBC: 6.1 10*3/uL (ref 4.0–10.5)

## 2017-01-03 LAB — PROTIME-INR
INR: 1.06
PROTHROMBIN TIME: 13.7 s (ref 11.4–15.2)

## 2017-01-03 LAB — APTT: aPTT: 30 seconds (ref 24–36)

## 2017-01-03 NOTE — Progress Notes (Signed)
Patient ID: Nichole [name], female   DOB: 06/17/1945, 71 y.o.   MRN: [id number]   Request for percutaneous gastric tube placement Pt imaging has been approved with IR MD  Pt on coumadin daily checking INR now  Afeb; labs good  Discussed with husband Wants to HOLD on Gastric tube til he meets with MD in [facility] He is coming in to Hospital today

## 2017-01-04 MED ORDER — CEFAZOLIN SODIUM-DEXTROSE 2-4 GM/100ML-% IV SOLN
2.0000 g | INTRAVENOUS | Status: AC
  Administered 2017-01-05: 2 g via INTRAVENOUS

## 2017-01-04 NOTE — Consult Note (Signed)
Chief Complaint: Patient was seen in consultation today for dysphagia  Referring Physician(s): Dr. Sharyon MedicusHijazi  Supervising Physician: Gilmer MorWagner, Jaime  [facility]  History of Present Illness: Nichole [name] is a 71 y.o. female with past medical history of cardiac arrest and respiratory failure who was admitted [facility] for ongoing [facility].  Patient has had NGT in place for approximately 2 weeks.  She has made progress with therapy, but continues with dysphagia and inadequate PO intake.  IR consulted for percutaneous gastrostomy placement at the request of Dr. Sharyon MedicusHijazi.   CT Abdomen obtained 01/02/17.  Reviewed and anatomy approved by Dr. Loreta AveWagner.   Note patient was transitioned to SQ heparin for prophylaxis.   No past medical history on file.  Allergies: No known allergies  Medications: Prior to Admission medications   Not on File     No family history on file.  Social History   Socioeconomic History  . Marital status: Married    Spouse name: Not on file  . Number of children: Not on file  . Years of education: Not on file  . Highest education level: Not on file  Social Needs  . Financial resource strain: Not on file  . Food insecurity - worry: Not on file  . Food insecurity - inability: Not on file  . Transportation needs - medical: Not on file  . Transportation needs - non-medical: Not on file  Occupational History  . Not on file  Tobacco Use  . Smoking status: Not on file  Substance and Sexual Activity  . Alcohol use: Not on file  . Drug use: Not on file  . Sexual activity: Not on file  Other Topics Concern  . Not on file  Social History Narrative  . Not on file    Review of Systems  Constitutional: Negative for fatigue and fever.  Respiratory: Negative for cough and shortness of breath.   Cardiovascular: Negative for chest pain.  Gastrointestinal: Negative for abdominal pain.  Psychiatric/Behavioral: Negative  for behavioral problems and confusion.    Vital Signs: There were no vitals taken for this visit.  Physical Exam  Constitutional: She is oriented to person, place, and time. She appears well-developed.  Cardiovascular: Normal rate, regular rhythm and normal heart sounds.  Pulmonary/Chest: Effort normal and breath sounds normal. No respiratory distress.  Abdominal: Soft. There is no tenderness.  Neurological: She is alert and oriented to person, place, and time.  Skin: Skin is warm and dry.  Nursing note and vitals reviewed.   Imaging: Ct Abdomen Wo Contrast  Result Date: 01/02/2017 CLINICAL DATA:  71 year old female with dysphagia EXAM: CT ABDOMEN WITHOUT CONTRAST TECHNIQUE: Multidetector CT imaging of the abdomen was performed following the standard protocol without IV contrast. COMPARISON:  None. FINDINGS: Lower chest: Calcifications of left main, left anterior descending, circumflex, right coronary arteries. No pericardial effusion/thickening. Atelectatic changes at the lung bases. Gastric tube traverses the esophagus terminating in the stomach. Hepatobiliary: Unremarkable liver. Multiple radiopaque gallstones layer dependently within the gallbladder lumen. No pericholecystic fluid or inflammatory changes. Pancreas: Atrophic pancreas parenchyma. Spleen: Unremarkable spleen Adrenals/Urinary Tract: Unremarkable adrenal glands. Bilateral nonobstructive nephrolithiasis with multiple 1 mm -2 mm stones. No hydronephrosis. No perinephric stranding. Unremarkable appearance of the visualized ureters. Stomach/Bowel: Unremarkable stomach. Unremarkable small bowel. Normal appendix. Colonic diverticula within the splenic flexure and proximal descending colon. No abnormally distended small bowel or colon. Vascular/Lymphatic: Vascular calcifications. Other: None Musculoskeletal: No acute displaced fracture. Degenerative changes of visualized spine.  IMPRESSION: No acute finding. Cholelithiasis. Nonobstructive  bilateral nephrolithiasis. Diverticular disease. Aortic atherosclerosis with associated left main and 3 vessel coronary artery disease. Aortic Atherosclerosis (ICD10-I70.0). Signed, Yvone Neu. Loreta Ave, DO [facility] Electronically Signed   By: Gilmer Mor D.O.   On: 01/02/2017 15:44   Dg Abd Portable 1v  Result Date: 12/25/2016 CLINICAL DATA:  71 y/o  F; enteric tube placement. EXAM: PORTABLE ABDOMEN - 1 VIEW COMPARISON:  12/24/2016 abdomen radiograph FINDINGS: Normal bowel gas pattern. Enteric tube tip projects over distal stomach. Left hemiabdomen partially excluded from field of view. Bones are unremarkable. IMPRESSION: Enteric tube tip projects over distal stomach. Electronically Signed   By: Mitzi Hansen M.D.   On: 12/25/2016 00:23   Dg Abd Portable 1v  Result Date: 12/24/2016 CLINICAL DATA:  Nasogastric tube placement EXAM: PORTABLE ABDOMEN - 1 VIEW COMPARISON:  12/02/2016 FINDINGS: Feeding tube loops in the stomach, tip in the fundus. Small bowel decompressed. Residual oral contrast material in the nondilated colon. Probable degenerated calcified uterine fibroid in the pelvis as before. Mild spondylitic changes in the lumbar spine. IMPRESSION: 1. Feeding tube loops in the stomach, tip in the fundus. Electronically Signed   By: Corlis Leak M.D.   On: 12/24/2016 10:35    Labs:  CBC: Recent Labs    12/16/16 0556 12/20/16 0623 12/27/16 0654 01/03/17 0702  WBC 7.3 9.3 6.9 6.1  HGB 8.4* 9.1* 8.6* 8.8*  HCT 28.3* 29.9* 27.5* 28.8*  PLT 315 247 223 177    COAGS: Recent Labs    01/03/17 1128  INR 1.06  APTT 30    BMP: Recent Labs    12/24/16 0429 12/27/16 0654 12/30/16 0624 01/03/17 0702  NA 129* 128* 132* 130*  K 4.3 4.1 4.1 3.9  CL 94* 94* 97* 94*  CO2 20* 26 27 29   GLUCOSE 105* 108* 148* 161*  BUN 59* 52* 39* 45*  CALCIUM 9.3 9.2 9.5 9.2  CREATININE 1.44* 1.06* 1.02* 1.15*  GFRNONAA 36* 52* 54* 47*   GFRAA 42* >60 >60 55*    LIVER FUNCTION TESTS: Recent Labs    11/30/16 0545  BILITOT 0.9  AST 79*  ALT 39  ALKPHOS 226*  PROT 5.2*  ALBUMIN 1.9*    TUMOR MARKERS: No results for input(s): AFPTM, CEA, CA199, CHROMGRNA in the last 8760 hours.  Assessment and Plan: Patient with past medical history of cardiac arrest and respiratory failure now extubated and progressing with therapy presents with complaint of ongoing dysphagia and poor PO intake.  IR consulted for percutaneous gastrostomy placement at the request of Dr. Sharyon Medicus. Case reviewed by Dr. Loreta Ave who approves patient for procedure.  Patient presents today in their usual state of health.  Hold nocturnal tube feeds.  Hold morning heparin dose.  Per medical team, husband and family are wanting to move forward with placement of feeding tube.  Will reach out to family for consent.  Risks and benefits discussed with the patient including, but not limited to the need for a barium enema during the procedure, bleeding, infection, peritonitis, or damage to adjacent structures. All of the patient's questions were answered, patient is agreeable to proceed. Consent signed and in chart.   Thank you for this interesting consult.  I greatly enjoyed meeting [facility] and look forward to participating in their care.  A copy of this report was sent to the requesting provider on this date.  Electronically Signed: Hoyt Koch, PA 01/04/2017, 2:39 PM  I spent a total of 40 Minutes    in face to face in clinical consultation, greater than 50% of which was counseling/coordinating care for dysphagia.

## 2017-01-05 ENCOUNTER — Encounter (HOSPITAL_COMMUNITY): Payer: Self-pay | Admitting: Diagnostic Radiology

## 2017-01-05 ENCOUNTER — Other Ambulatory Visit (HOSPITAL_COMMUNITY): Payer: Medicare Other

## 2017-01-05 HISTORY — PX: IR GASTROSTOMY TUBE MOD SED: IMG625

## 2017-01-05 MED ORDER — LIDOCAINE HCL 1 % IJ SOLN
INTRAMUSCULAR | Status: AC
  Filled 2017-01-05: qty 20

## 2017-01-05 MED ORDER — FENTANYL CITRATE (PF) 100 MCG/2ML IJ SOLN
INTRAMUSCULAR | Status: AC
  Filled 2017-01-05: qty 4

## 2017-01-05 MED ORDER — LIDOCAINE HCL (PF) 1 % IJ SOLN
INTRAMUSCULAR | Status: AC | PRN
  Administered 2017-01-05: 8 mL

## 2017-01-05 MED ORDER — CEFAZOLIN SODIUM-DEXTROSE 2-4 GM/100ML-% IV SOLN
INTRAVENOUS | Status: AC
  Administered 2017-01-05: 2 g via INTRAVENOUS
  Filled 2017-01-05: qty 100

## 2017-01-05 MED ORDER — GLUCAGON HCL (RDNA) 1 MG IJ SOLR
INTRAMUSCULAR | Status: AC | PRN
  Administered 2017-01-05: 1 mg via INTRAVENOUS

## 2017-01-05 MED ORDER — FENTANYL CITRATE (PF) 100 MCG/2ML IJ SOLN
INTRAMUSCULAR | Status: AC | PRN
  Administered 2017-01-05 (×2): 50 ug via INTRAVENOUS

## 2017-01-05 MED ORDER — MIDAZOLAM HCL 2 MG/2ML IJ SOLN
INTRAMUSCULAR | Status: AC
  Filled 2017-01-05: qty 4

## 2017-01-05 MED ORDER — IOPAMIDOL (ISOVUE-300) INJECTION 61%
INTRAVENOUS | Status: AC
  Administered 2017-01-05: 15 mL
  Filled 2017-01-05: qty 50

## 2017-01-05 MED ORDER — GLUCAGON HCL RDNA (DIAGNOSTIC) 1 MG IJ SOLR
INTRAMUSCULAR | Status: AC
  Filled 2017-01-05: qty 1

## 2017-01-05 MED ORDER — MIDAZOLAM HCL 2 MG/2ML IJ SOLN
INTRAMUSCULAR | Status: AC | PRN
  Administered 2017-01-05 (×2): 1 mg via INTRAVENOUS

## 2017-01-05 NOTE — Procedures (Signed)
Placement of 20 Fr Gastrostomy tube. Minimal blood loss and no immediate complication.

## 2017-01-05 NOTE — Sedation Documentation (Signed)
Patient states NKA

## 2017-01-05 NOTE — Sedation Documentation (Signed)
Patient denies pain and is resting comfortably.  

## 2017-01-05 NOTE — Sedation Documentation (Signed)
Patient is resting comfortably. 

## 2017-01-05 NOTE — Sedation Documentation (Signed)
IR tech placed OG tube

## 2017-01-07 LAB — CBC
HCT: 28 % — ABNORMAL LOW (ref 36.0–46.0)
Hemoglobin: 8.9 g/dL — ABNORMAL LOW (ref 12.0–15.0)
MCH: 29.6 pg (ref 26.0–34.0)
MCHC: 31.8 g/dL (ref 30.0–36.0)
MCV: 93 fL (ref 78.0–100.0)
PLATELETS: 177 10*3/uL (ref 150–400)
RBC: 3.01 MIL/uL — AB (ref 3.87–5.11)
RDW: 16.1 % — AB (ref 11.5–15.5)
WBC: 5.9 10*3/uL (ref 4.0–10.5)

## 2017-01-07 LAB — BASIC METABOLIC PANEL
ANION GAP: 9 (ref 5–15)
BUN: 27 mg/dL — ABNORMAL HIGH (ref 6–20)
CALCIUM: 8.8 mg/dL — AB (ref 8.9–10.3)
CO2: 26 mmol/L (ref 22–32)
Chloride: 95 mmol/L — ABNORMAL LOW (ref 101–111)
Creatinine, Ser: 0.99 mg/dL (ref 0.44–1.00)
GFR, EST NON AFRICAN AMERICAN: 56 mL/min — AB (ref >60)
GLUCOSE: 160 mg/dL — AB (ref 65–99)
POTASSIUM: 3.8 mmol/L (ref 3.5–5.1)
SODIUM: 130 mmol/L — AB (ref 135–145)

## 2018-05-05 IMAGING — DX DG ABD PORTABLE 1V
1 series · 1 of 1 positions shown · non-contrast
Comparison: 12/02/2016

CLINICAL DATA: Nasogastric tube placement

EXAM:
PORTABLE ABDOMEN - 1 VIEW

[abdomen kub]
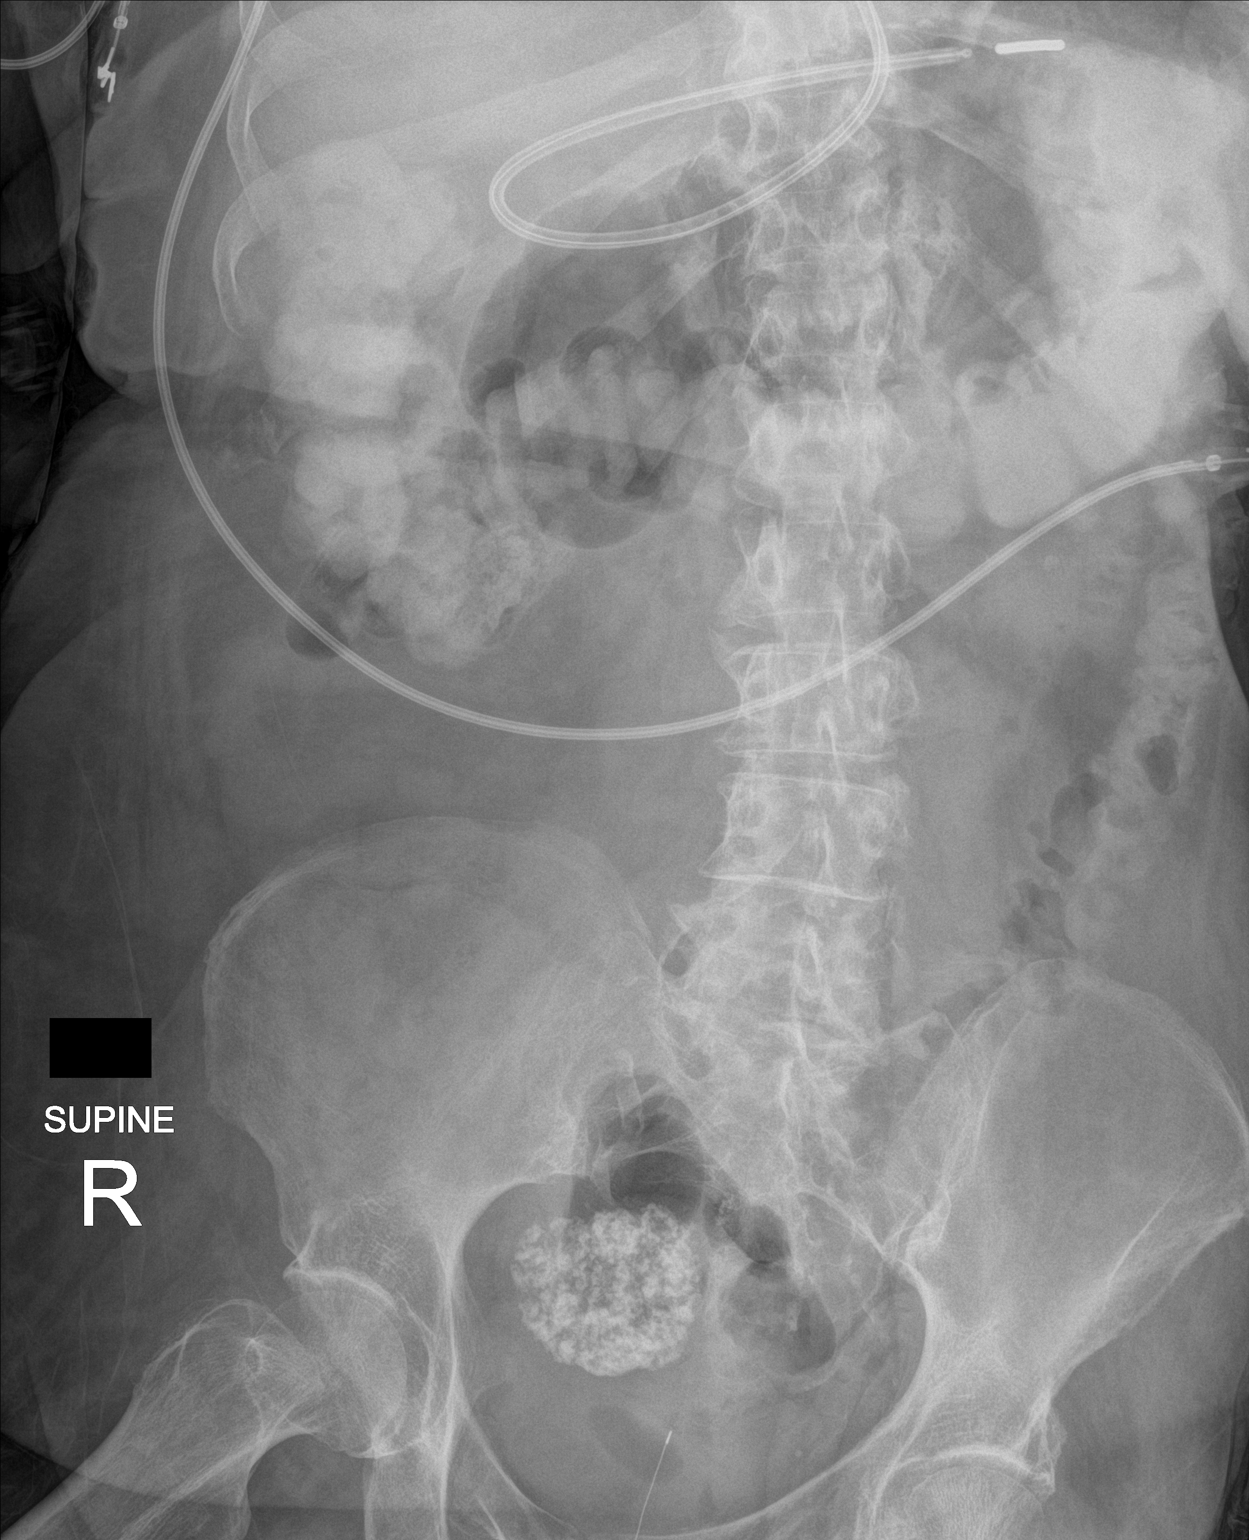

[1 of 1 positions shown; findings below may reference images not displayed]

FINDINGS: Feeding tube loops in the stomach, tip in the fundus. Small bowel
decompressed. Residual oral contrast material in the nondilated
colon. Probable degenerated calcified uterine fibroid in the pelvis
as before. Mild spondylitic changes in the lumbar spine.
IMPRESSION: 1. Feeding tube loops in the stomach, tip in the fundus.

## 2018-05-17 IMAGING — XA IR PERC PLACEMENT GASTROSTOMY
1 series · 3 of 3 positions shown · non-contrast
Comparison: none

INDICATION: 71-year-old with dysphagia.  Request for gastrostomy tube placement.

[Series 300: dsa body · 3 of 3 slices shown]
[im 1/3]
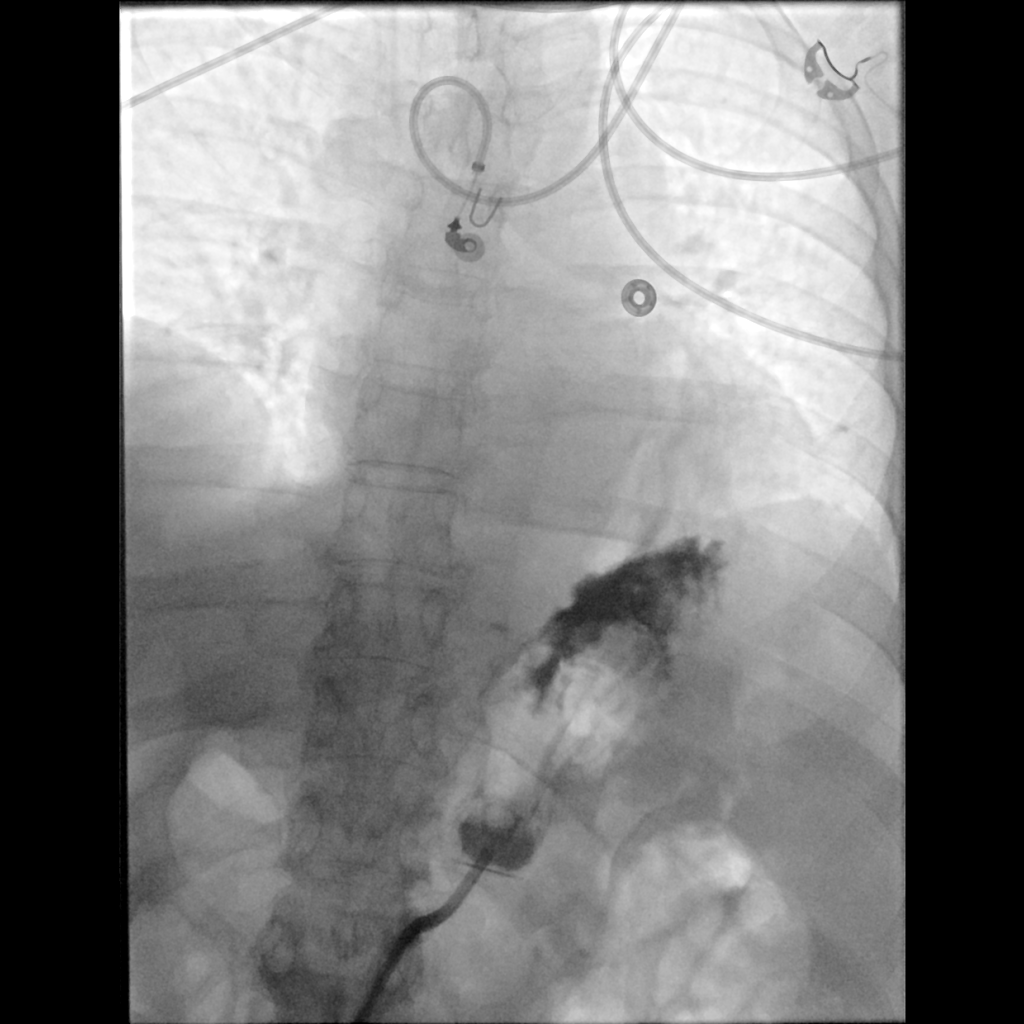
[im 2/3]
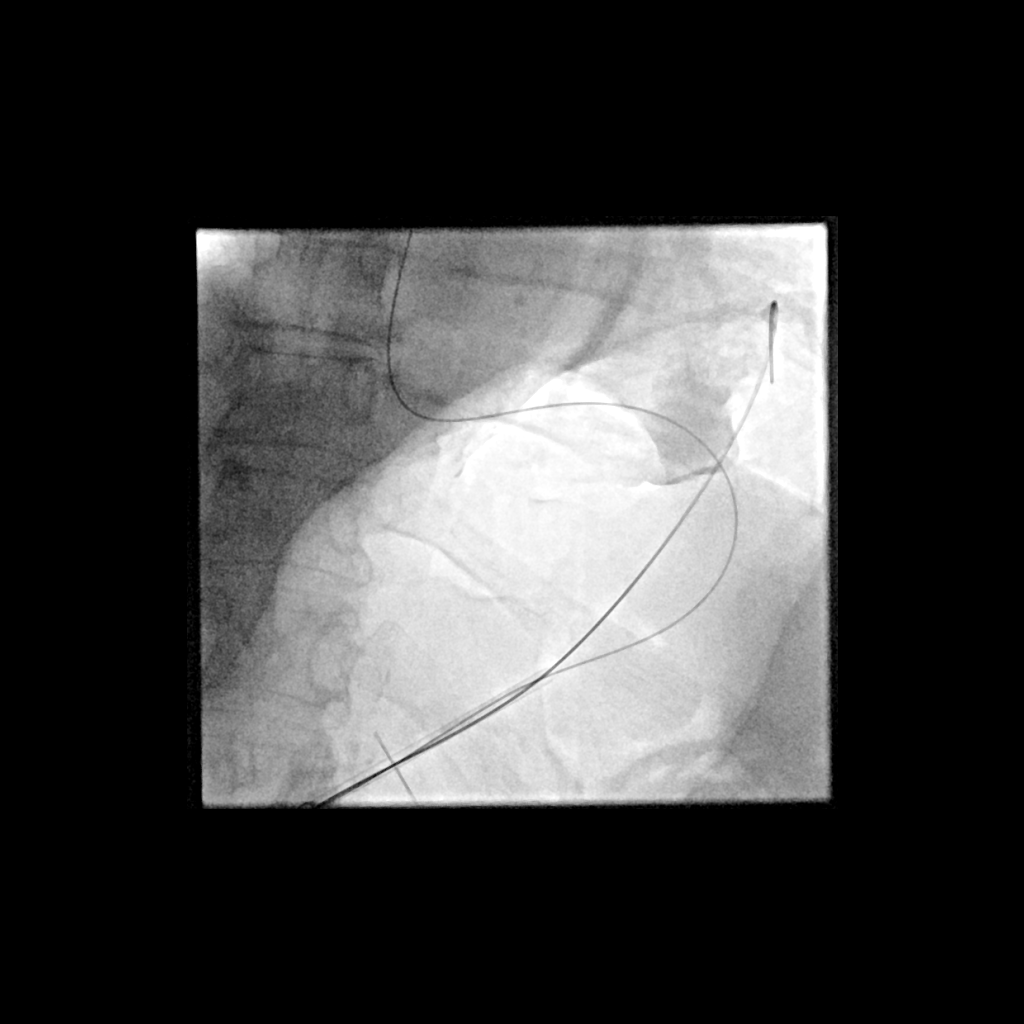
[im 3/3]
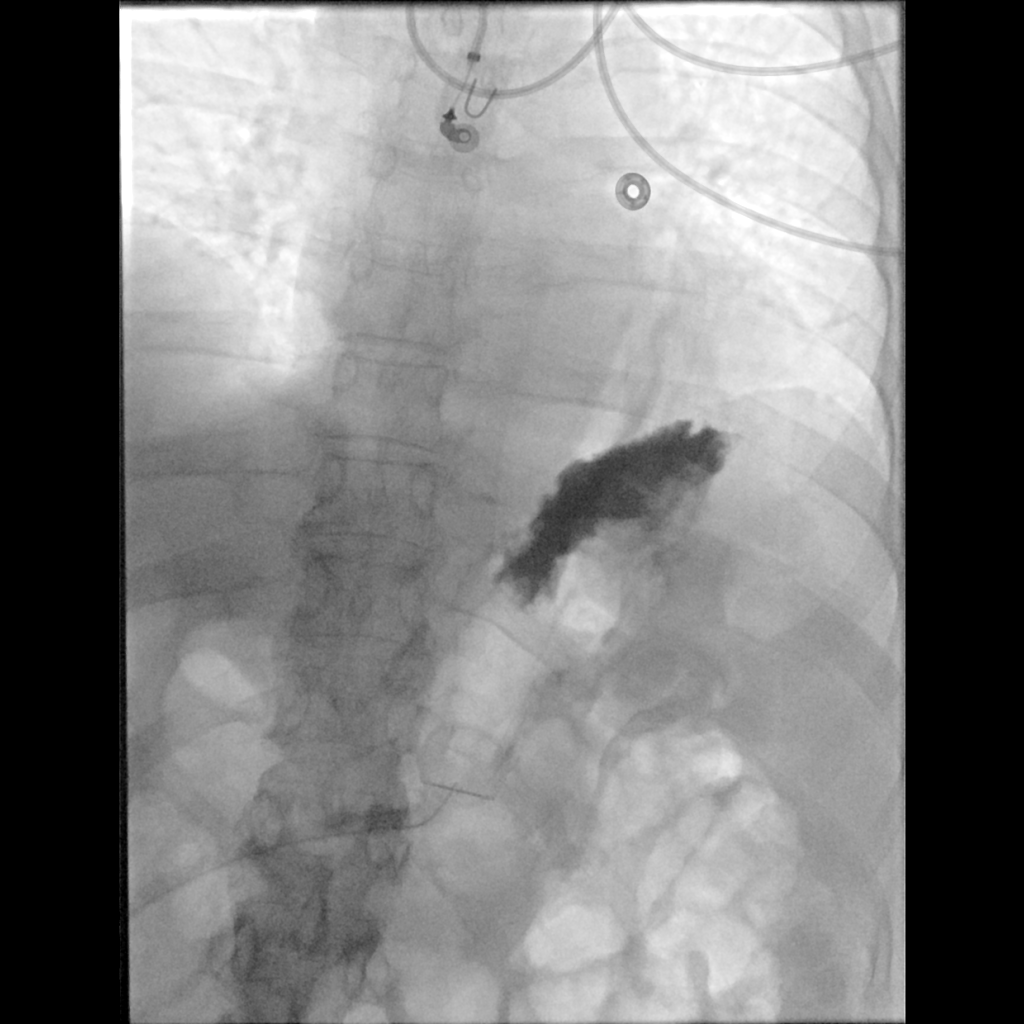

[3 of 3 positions shown; findings below may reference images not displayed]

EXAM:
PERCUTANEOUS GASTROSTOMY TUBE WITH FLUOROSCOPIC GUIDANCE

MEDICATIONS:
Ancef 2 g; Antibiotics were administered within 1 hour of the
procedure. Glucagon 1 mg IV

ANESTHESIA/SEDATION:
Versed 2.0 mg IV; Fentanyl 100 mcg IV

Moderate Sedation Time:  15 minutes

The patient was continuously monitored during the procedure by the
interventional radiology nurse under my direct supervision.

FLUOROSCOPY TIME:  Fluoroscopy Time: 4 minutes 36 seconds (13 mGy).

COMPLICATIONS:
None immediate.

PROCEDURE:
Informed consent was obtained for a percutaneous gastrostomy tube.
The patient was placed on the interventional table. Fluoroscopy
demonstrated gas in the transverse colon. An orogastric tube was
placed with fluoroscopic guidance. The anterior abdomen was prepped
and draped in sterile fashion. Maximal barrier sterile technique was
utilized including caps, mask, sterile gowns, sterile gloves,
sterile drape, hand hygiene and skin antiseptic. Stomach was
inflated with air through the orogastric tube. The skin and
subcutaneous tissues were anesthetized with 1% lidocaine. A 17 gauge
needle was directed into the distended stomach with fluoroscopic
guidance. A wire was advanced into the stomach and Jeuk Manzar was
deployed. A 9-French vascular sheath was placed and the orogastric
tube was snared using a Gooseneck snare device. The orogastric tube
and snare were pulled out of the patient's mouth. The snare device
was connected to a 20-French gastrostomy tube. The snare device and
gastrostomy tube were pulled through the patient's mouth and out the
anterior abdominal wall. The gastrostomy tube was cut to an
appropriate length. Contrast injection through gastrostomy tube
confirmed placement within the stomach. Fluoroscopic images were
obtained for documentation. The gastrostomy tube was flushed with
normal saline.
IMPRESSION: Successful fluoroscopic guided percutaneous gastrostomy tube
placement.

## 2021-02-17 DEATH — deceased
